# Patient Record
Sex: Female | Born: 1965 | Smoking: Former smoker
Health system: Southern US, Community
[De-identification: ages and names within clinical notes are randomized; demographics above are authoritative.]

---

## 2013-03-16 DIAGNOSIS — I10 Essential (primary) hypertension: Secondary | ICD-10-CM | POA: Insufficient documentation

## 2013-10-31 DIAGNOSIS — F411 Generalized anxiety disorder: Secondary | ICD-10-CM | POA: Insufficient documentation

## 2018-04-25 DIAGNOSIS — M353 Polymyalgia rheumatica: Secondary | ICD-10-CM | POA: Insufficient documentation

## 2020-02-07 DIAGNOSIS — E538 Deficiency of other specified B group vitamins: Secondary | ICD-10-CM | POA: Insufficient documentation

## 2020-11-04 ENCOUNTER — Other Ambulatory Visit: Payer: Self-pay | Admitting: Adult Health

## 2020-11-04 DIAGNOSIS — F411 Generalized anxiety disorder: Secondary | ICD-10-CM

## 2020-11-04 MED ORDER — CLONAZEPAM 1 MG PO TABS: 1.0000 mg | ORAL_TABLET | Freq: Two times a day (BID) | ORAL | 2 refills | Status: DC | PRN

## 2020-12-10 ENCOUNTER — Other Ambulatory Visit: Payer: Self-pay | Admitting: Adult Health

## 2020-12-10 DIAGNOSIS — G4700 Insomnia, unspecified: Secondary | ICD-10-CM

## 2020-12-10 MED ORDER — ZOLPIDEM TARTRATE 10 MG PO TABS: 10.0000 mg | ORAL_TABLET | Freq: Every evening | ORAL | 2 refills | Status: DC | PRN

## 2021-01-27 ENCOUNTER — Other Ambulatory Visit: Payer: Self-pay | Admitting: Adult Health

## 2021-01-27 DIAGNOSIS — F411 Generalized anxiety disorder: Secondary | ICD-10-CM

## 2021-01-27 MED ORDER — CLONAZEPAM 1 MG PO TABS: 1.0000 mg | ORAL_TABLET | Freq: Two times a day (BID) | ORAL | 2 refills | Status: DC | PRN

## 2021-03-10 ENCOUNTER — Other Ambulatory Visit: Payer: Self-pay | Admitting: Adult Health

## 2021-03-10 DIAGNOSIS — G4700 Insomnia, unspecified: Secondary | ICD-10-CM

## 2021-03-10 MED ORDER — ZOLPIDEM TARTRATE 10 MG PO TABS: 10.0000 mg | ORAL_TABLET | Freq: Every evening | ORAL | 2 refills | Status: DC | PRN

## 2021-04-05 ENCOUNTER — Other Ambulatory Visit: Payer: Self-pay | Admitting: Adult Health

## 2021-04-05 DIAGNOSIS — F411 Generalized anxiety disorder: Secondary | ICD-10-CM

## 2021-04-05 DIAGNOSIS — G4700 Insomnia, unspecified: Secondary | ICD-10-CM

## 2021-04-05 MED ORDER — FLUOXETINE HCL 20 MG PO CAPS: 60.0000 mg | ORAL_CAPSULE | Freq: Every day | ORAL | 1 refills | Status: DC

## 2021-04-06 DIAGNOSIS — K2970 Gastritis, unspecified, without bleeding: Secondary | ICD-10-CM | POA: Insufficient documentation

## 2021-04-14 ENCOUNTER — Other Ambulatory Visit: Payer: Self-pay | Admitting: Adult Health

## 2021-04-14 DIAGNOSIS — F411 Generalized anxiety disorder: Secondary | ICD-10-CM

## 2021-04-14 MED ORDER — FLUOXETINE HCL 40 MG PO CAPS: 40.0000 mg | ORAL_CAPSULE | Freq: Every day | ORAL | 3 refills | Status: DC

## 2021-04-20 ENCOUNTER — Other Ambulatory Visit: Payer: Self-pay | Admitting: Adult Health

## 2021-04-20 DIAGNOSIS — F411 Generalized anxiety disorder: Secondary | ICD-10-CM

## 2021-04-20 MED ORDER — CLONAZEPAM 1 MG PO TABS: 1.0000 mg | ORAL_TABLET | Freq: Two times a day (BID) | ORAL | 2 refills | Status: DC | PRN

## 2021-04-21 ENCOUNTER — Telehealth (INDEPENDENT_AMBULATORY_CARE_PROVIDER_SITE_OTHER): Payer: Self-pay | Admitting: Adult Health

## 2021-04-21 DIAGNOSIS — F411 Generalized anxiety disorder: Secondary | ICD-10-CM

## 2021-04-21 DIAGNOSIS — F331 Major depressive disorder, recurrent, moderate: Secondary | ICD-10-CM

## 2021-04-21 DIAGNOSIS — G4700 Insomnia, unspecified: Secondary | ICD-10-CM

## 2021-04-21 NOTE — Progress Notes (Addendum)
Amber Joyce [id number] [birth year] 55 y.o.  Virtual Visit via Telephone Note  I connected with pt on 04/21/21 at  6:00 PM EDT by telephone and verified that I am speaking with the correct person using two identifiers.   I discussed the limitations, risks, security and privacy concerns of performing an evaluation and management service by telephone and the availability of in person appointments. I also discussed with the patient that there may be a patient responsible charge related to this service. The patient expressed understanding and agreed to proceed.   I discussed the assessment and treatment plan with the patient. The patient was provided an opportunity to ask questions and all were answered. The patient agreed with the plan and demonstrated an understanding of the instructions.   The patient was advised to call back or seek an in-person evaluation if the symptoms worsen or if the condition fails to improve as anticipated.  I provided 20 minutes of non-face-to-face time during this encounter.  The patient was located at home.  The provider was located at [facility].   Dorothyann Gibbs, NP   Subjective:   Patient ID:  Amber Joyce is a 55 y.o. (DOB Aug 08, 1966) female.  Chief Complaint: No chief complaint on file.   HPI  Kellianne Ek presents for follow-up of MDD, GAD, and insomnia.  Describes mood today as "ok". Pleasant. Denies tearfulness. Mood symptoms - denies depression, anxiety, and irritability. Stating "I'm doing pretty good". She and husband going well. Daughter and grandson local. Stable interest and motivation. Taking medications as prescribed.  Energy levels stable. Active, does not have a regular exercise routine. Enjoys some usual interests and activities. Married. Lives with husband. Spending time with family. Appetite adequate. Weight stable. Sleeps well most nights. Averages 8 hours. Focus and concentration stable. Completing tasks. Managing aspects  of household.  Works full-time. Denies SI or HI.  Denies AH or VH.  Previous medication trials:  Denies  Review of Systems:  Review of Systems  Musculoskeletal:  Negative for gait problem.  Neurological:  Negative for tremors.  Psychiatric/Behavioral:         Please refer to HPI   Medications: I have reviewed the patient's current medications.  Current Outpatient Medications  Medication Sig Dispense Refill   clonazePAM (KLONOPIN) 1 MG tablet Take 1 tablet (1 mg total) by mouth 2 (two) times daily as needed. 60 tablet 2   fenofibrate micronized (LOFIBRA) 200 MG capsule Take 200 mg by mouth daily.     FLUoxetine (PROZAC) 20 MG capsule Take 3 capsules (60 mg total) by mouth daily. 90 capsule 1   FLUoxetine (PROZAC) 40 MG capsule Take 1 capsule (40 mg total) by mouth daily. 90 capsule 3   fluticasone (FLONASE) 50 MCG/ACT nasal spray Place into both nostrils.     metFORMIN (GLUCOPHAGE) 1000 MG tablet Take 1,000 mg by mouth daily.     omeprazole (PRILOSEC) 40 MG capsule Take 40 mg by mouth 2 (two) times daily.     OZEMPIC, 1 MG/DOSE, 4 MG/3ML SOPN      predniSONE (DELTASONE) 1 MG tablet Take 4 mg by mouth daily.     promethazine (PHENERGAN) 25 MG tablet Take by mouth.     rosuvastatin (CRESTOR) 20 MG tablet Take 20 mg by mouth at bedtime.     telmisartan-hydrochlorothiazide (MICARDIS HCT) 40-12.5 MG tablet Take 1 tablet by mouth daily.     zolpidem (AMBIEN) 10 MG tablet Take 1 tablet (10 mg total) by mouth at bedtime as  needed. 30 tablet 2   No current facility-administered medications for this visit.    Medication Side Effects: None  Allergies:  Allergies  Allergen Reactions   Prednisone Anaphylaxis    Other reaction(s): Other (See Comments)   Azithromycin     Other reaction(s): GI Upset (intolerance)    No past medical history on file.  No family history on file.  Social History   Socioeconomic History   Marital status: Unknown    Spouse name: Not on file   Number of  children: Not on file   Years of education: Not on file   Highest education level: Not on file  Occupational History   Not on file  Tobacco Use   Smoking status: Not on file   Smokeless tobacco: Not on file  Substance and Sexual Activity   Alcohol use: Not on file   Drug use: Not on file   Sexual activity: Not on file  Other Topics Concern   Not on file  Social History Narrative   Not on file   Social Determinants of Health   Financial Resource Strain: Not on file  Food Insecurity: Not on file  Transportation Needs: Not on file  Physical Activity: Not on file  Stress: Not on file  Social Connections: Not on file  Intimate Partner Violence: Not on file    Past Medical History, Surgical history, Social history, and Family history were reviewed and updated as appropriate.   Please see review of systems for further details on the patient's review from today.   Objective:   Physical Exam:  There were no vitals taken for this visit.  Physical Exam Constitutional:      General: She is not in acute distress. Musculoskeletal:        General: No deformity.  Neurological:     Mental Status: She is alert and oriented to person, place, and time.     Coordination: Coordination normal.  Psychiatric:        Attention and Perception: Attention and perception normal. She does not perceive auditory or visual hallucinations.        Mood and Affect: Mood normal. Mood is not anxious or depressed. Affect is not labile, blunt, angry or inappropriate.        Speech: Speech normal.        Behavior: Behavior normal.        Thought Content: Thought content normal. Thought content is not paranoid or delusional. Thought content does not include homicidal or suicidal ideation. Thought content does not include homicidal or suicidal plan.        Cognition and Memory: Cognition and memory normal.        Judgment: Judgment normal.     Comments: Insight intact    Lab Review:  No results found  for: NA, K, CL, CO2, GLUCOSE, BUN, CREATININE, CALCIUM, PROT, ALBUMIN, AST, ALT, ALKPHOS, BILITOT, GFRNONAA, GFRAA  No results found for: WBC, RBC, HGB, HCT, PLT, MCV, MCH, MCHC, RDW, LYMPHSABS, MONOABS, EOSABS, BASOSABS  No results found for: POCLITH, LITHIUM   No results found for: PHENYTOIN, PHENOBARB, VALPROATE, CBMZ   .res Assessment: Plan:    Plan:  PDMP reviewed  Clonazepam 1mg  BID Prozac 40mg  daily Ambien 10mg  at hs  RTC 6 months  Patient advised to contact office with any questions, adverse effects, or acute worsening in signs and symptoms.  Discussed potential benefits, risk, and side effects of benzodiazepines to include potential risk of tolerance and dependence, as well as possible  drowsiness.  Advised patient not to drive if experiencing drowsiness and to take lowest possible effective dose to minimize risk of dependence and tolerance.     Diagnoses and all orders for this visit:  Insomnia, unspecified type  Generalized anxiety disorder  Major depressive disorder, recurrent episode, moderate (HCC)   Please see After Visit Summary for patient specific instructions.  Future Appointments  Date Time Provider Department Center  04/21/2021  6:00 PM Kimbly Eanes, Thereasa Solo, NP CP-CP None    No orders of the defined types were placed in this encounter.     -------------------------------

## 2021-06-03 ENCOUNTER — Other Ambulatory Visit: Payer: Self-pay | Admitting: Adult Health

## 2021-06-03 DIAGNOSIS — G4700 Insomnia, unspecified: Secondary | ICD-10-CM

## 2021-06-04 NOTE — Telephone Encounter (Signed)
Appt due next year last filled 7/29

## 2021-07-14 ENCOUNTER — Other Ambulatory Visit: Payer: Self-pay | Admitting: Adult Health

## 2021-07-14 DIAGNOSIS — F411 Generalized anxiety disorder: Secondary | ICD-10-CM

## 2021-07-14 MED ORDER — CLONAZEPAM 1 MG PO TABS
1.0000 mg | ORAL_TABLET | Freq: Two times a day (BID) | ORAL | 2 refills | Status: DC | PRN
Start: 2021-07-14 — End: 2021-08-27

## 2021-08-27 ENCOUNTER — Other Ambulatory Visit: Payer: Self-pay | Admitting: Adult Health

## 2021-08-27 DIAGNOSIS — F411 Generalized anxiety disorder: Secondary | ICD-10-CM

## 2021-08-27 DIAGNOSIS — G4700 Insomnia, unspecified: Secondary | ICD-10-CM

## 2021-08-27 MED ORDER — ZOLPIDEM TARTRATE 10 MG PO TABS: 10.0000 mg | ORAL_TABLET | Freq: Every evening | ORAL | 2 refills | Status: DC | PRN

## 2021-08-27 MED ORDER — CLONAZEPAM 1 MG PO TABS: 1.0000 mg | ORAL_TABLET | Freq: Two times a day (BID) | ORAL | 2 refills | Status: DC | PRN

## 2021-10-12 ENCOUNTER — Other Ambulatory Visit: Payer: Self-pay | Admitting: Adult Health

## 2021-10-12 DIAGNOSIS — G4700 Insomnia, unspecified: Secondary | ICD-10-CM

## 2021-10-12 MED ORDER — FLUOXETINE HCL 20 MG PO CAPS: 60.0000 mg | ORAL_CAPSULE | Freq: Every day | ORAL | 1 refills | Status: DC

## 2021-11-16 DIAGNOSIS — D649 Anemia, unspecified: Secondary | ICD-10-CM | POA: Insufficient documentation

## 2021-11-16 DIAGNOSIS — Z1231 Encounter for screening mammogram for malignant neoplasm of breast: Secondary | ICD-10-CM | POA: Insufficient documentation

## 2021-11-16 DIAGNOSIS — E781 Pure hyperglyceridemia: Secondary | ICD-10-CM | POA: Insufficient documentation

## 2021-11-18 ENCOUNTER — Other Ambulatory Visit: Payer: Self-pay | Admitting: Adult Health

## 2021-11-18 DIAGNOSIS — G4700 Insomnia, unspecified: Secondary | ICD-10-CM

## 2021-11-18 MED ORDER — ZOLPIDEM TARTRATE 10 MG PO TABS: 10.0000 mg | ORAL_TABLET | Freq: Every evening | ORAL | 2 refills | Status: DC | PRN

## 2021-11-30 ENCOUNTER — Other Ambulatory Visit: Payer: Self-pay | Admitting: Adult Health

## 2021-11-30 DIAGNOSIS — F411 Generalized anxiety disorder: Secondary | ICD-10-CM

## 2021-11-30 MED ORDER — CLONAZEPAM 1 MG PO TABS: 1.0000 mg | ORAL_TABLET | Freq: Two times a day (BID) | ORAL | 2 refills | Status: DC | PRN

## 2021-12-16 ENCOUNTER — Telehealth: Payer: Self-pay | Admitting: Adult Health

## 2021-12-16 NOTE — Telephone Encounter (Signed)
Received [employer] Forms. Placed in Traci's box 3/9 ?

## 2021-12-28 NOTE — Telephone Encounter (Signed)
Please see message from patient regarding disability paperwork.  ?

## 2021-12-28 NOTE — Telephone Encounter (Signed)
Pt LVM @10 :01a.  She said she was returning a call from [place].  She said if the paperwork was from [place] to throw away.  She said another doctor got the same paperwork and she's not sure what it's about. ? ?No upcoming appt scheduled. ?

## 2021-12-29 NOTE — Telephone Encounter (Signed)
Noted thanks and will do. ?

## 2022-01-11 DIAGNOSIS — R5382 Chronic fatigue, unspecified: Secondary | ICD-10-CM | POA: Insufficient documentation

## 2022-02-08 ENCOUNTER — Other Ambulatory Visit: Payer: Self-pay | Admitting: Adult Health

## 2022-02-08 DIAGNOSIS — G4700 Insomnia, unspecified: Secondary | ICD-10-CM

## 2022-02-08 NOTE — Telephone Encounter (Signed)
Last filled 4/6, due 5/4 ?

## 2022-02-24 ENCOUNTER — Other Ambulatory Visit: Payer: Self-pay | Admitting: Adult Health

## 2022-02-24 DIAGNOSIS — F411 Generalized anxiety disorder: Secondary | ICD-10-CM

## 2022-03-31 ENCOUNTER — Other Ambulatory Visit: Payer: Self-pay

## 2022-03-31 ENCOUNTER — Emergency Department (HOSPITAL_BASED_OUTPATIENT_CLINIC_OR_DEPARTMENT_OTHER)
Admission: EM | Admit: 2022-03-31 | Discharge: 2022-03-31 | Disposition: A | Discharge status: Home / Self Care | Payer: Self-pay | Attending: Emergency Medicine | Admitting: Emergency Medicine

## 2022-03-31 ENCOUNTER — Encounter (HOSPITAL_BASED_OUTPATIENT_CLINIC_OR_DEPARTMENT_OTHER): Payer: Self-pay | Admitting: Pediatrics

## 2022-03-31 ENCOUNTER — Emergency Department (HOSPITAL_BASED_OUTPATIENT_CLINIC_OR_DEPARTMENT_OTHER): Payer: Self-pay

## 2022-03-31 DIAGNOSIS — S32424A Nondisplaced fracture of posterior wall of right acetabulum, initial encounter for closed fracture: Secondary | ICD-10-CM

## 2022-03-31 DIAGNOSIS — Z7984 Long term (current) use of oral hypoglycemic drugs: Secondary | ICD-10-CM | POA: Insufficient documentation

## 2022-03-31 DIAGNOSIS — W010XXA Fall on same level from slipping, tripping and stumbling without subsequent striking against object, initial encounter: Secondary | ICD-10-CM | POA: Insufficient documentation

## 2022-03-31 MED ORDER — OXYCODONE-ACETAMINOPHEN 5-325 MG PO TABS: 1.0000 | ORAL_TABLET | Freq: Three times a day (TID) | ORAL | 0 refills | Status: AC | PRN

## 2022-03-31 MED ORDER — FENTANYL CITRATE PF 50 MCG/ML IJ SOSY
25.0000 ug | PREFILLED_SYRINGE | Freq: Once | INTRAMUSCULAR | Status: AC
  Administered 2022-03-31: 25 ug via INTRAVENOUS
  Filled 2022-03-31: qty 1

## 2022-03-31 MED ORDER — OXYCODONE-ACETAMINOPHEN 5-325 MG PO TABS
1.0000 | ORAL_TABLET | ORAL | Status: AC | PRN
  Administered 2022-03-31 (×2): 1 via ORAL
  Filled 2022-03-31 (×2): qty 1

## 2022-03-31 MED ORDER — BACITRACIN ZINC 500 UNIT/GM EX OINT
TOPICAL_OINTMENT | Freq: Once | CUTANEOUS | Status: AC
Start: 2022-03-31 — End: 2022-03-31
  Filled 2022-03-31: qty 28.35

## 2022-03-31 NOTE — ED Notes (Signed)
Patient verbalizes understanding of discharge instructions. Opportunity for questioning and answers were provided. Armband removed by staff, pt discharged from ED. Ambulated out to lobby with walker per request

## 2022-03-31 NOTE — ED Notes (Signed)
Pt. Reports fall today with pain in the R hip and around to the R back area.  Pt. Reports she has had pain in the R pelvic area as well.  Pt. Able to bend with no pain.  Pt. Has noted injury to the R knee and just below the R knee an abrasion noted with controlled bleeding.

## 2022-03-31 NOTE — ED Notes (Signed)
Spoke with Thayer Ohm, answering service for Dr Linna Caprice , ortho on call at [facility]

## 2022-03-31 NOTE — ED Triage Notes (Signed)
Tripped over a cement walkway; c/o right knee and right hip pain;

## 2022-03-31 NOTE — ED Notes (Signed)
Patient transported to CT 

## 2022-03-31 NOTE — Discharge Instructions (Addendum)
Touchdown weight bearing is defined as not supporting any weight on the affected side by only touching the plantar aspect of the foot to the ground to maintain balance to protect the affected side from mechanical loading You will need to use the walker as directed. You will need to use this without weight bearing fully on your R leg as described above. I have placed you on pain medication.  You can take this with ibuprofen as needed for your discomfort. You will need to follow-up with Dr. Jena Gauss listed below within a week.  Call them tomorrow to make an appointment. Return to the ER if you start to experience worsening pain, additional injuries, numbness or weakness.

## 2022-05-02 ENCOUNTER — Other Ambulatory Visit: Payer: Self-pay | Admitting: Adult Health

## 2022-05-02 DIAGNOSIS — G4700 Insomnia, unspecified: Secondary | ICD-10-CM

## 2022-05-02 DIAGNOSIS — F411 Generalized anxiety disorder: Secondary | ICD-10-CM

## 2022-05-02 MED ORDER — CLONAZEPAM 1 MG PO TABS: 1.0000 mg | ORAL_TABLET | Freq: Two times a day (BID) | ORAL | 2 refills | Status: DC | PRN

## 2022-05-02 MED ORDER — ZOLPIDEM TARTRATE 10 MG PO TABS: 10.0000 mg | ORAL_TABLET | Freq: Every evening | ORAL | 2 refills | Status: DC | PRN

## 2022-05-02 MED ORDER — FLUOXETINE HCL 40 MG PO CAPS: 40.0000 mg | ORAL_CAPSULE | Freq: Every day | ORAL | 3 refills | Status: DC

## 2022-05-06 ENCOUNTER — Other Ambulatory Visit: Payer: Self-pay | Admitting: Adult Health

## 2022-05-06 DIAGNOSIS — G4700 Insomnia, unspecified: Secondary | ICD-10-CM

## 2022-05-06 DIAGNOSIS — F411 Generalized anxiety disorder: Secondary | ICD-10-CM

## 2022-05-06 MED ORDER — FLUOXETINE HCL 20 MG PO CAPS: 20.0000 mg | ORAL_CAPSULE | Freq: Every day | ORAL | 1 refills | Status: DC

## 2022-05-18 DIAGNOSIS — Z9181 History of falling: Secondary | ICD-10-CM | POA: Insufficient documentation

## 2022-05-20 ENCOUNTER — Other Ambulatory Visit: Payer: Self-pay | Admitting: Adult Health

## 2022-05-20 DIAGNOSIS — F411 Generalized anxiety disorder: Secondary | ICD-10-CM

## 2022-05-20 MED ORDER — CLONAZEPAM 1 MG PO TABS: 1.0000 mg | ORAL_TABLET | Freq: Two times a day (BID) | ORAL | 2 refills | Status: DC | PRN

## 2022-06-06 ENCOUNTER — Other Ambulatory Visit: Payer: Self-pay | Admitting: Adult Health

## 2022-06-06 DIAGNOSIS — G4700 Insomnia, unspecified: Secondary | ICD-10-CM

## 2022-06-07 NOTE — Telephone Encounter (Signed)
Pt coming in tomorrow at 1pm.

## 2022-06-07 NOTE — Telephone Encounter (Signed)
Please schedule appt

## 2022-06-08 ENCOUNTER — Encounter: Payer: Self-pay | Admitting: Adult Health

## 2022-06-08 ENCOUNTER — Ambulatory Visit (INDEPENDENT_AMBULATORY_CARE_PROVIDER_SITE_OTHER): Payer: Self-pay | Admitting: Adult Health

## 2022-06-08 DIAGNOSIS — G4700 Insomnia, unspecified: Secondary | ICD-10-CM

## 2022-06-08 DIAGNOSIS — F331 Major depressive disorder, recurrent, moderate: Secondary | ICD-10-CM

## 2022-06-08 DIAGNOSIS — F411 Generalized anxiety disorder: Secondary | ICD-10-CM

## 2022-06-08 MED ORDER — ZOLPIDEM TARTRATE 10 MG PO TABS: 10.0000 mg | ORAL_TABLET | Freq: Every evening | ORAL | 2 refills | Status: DC | PRN

## 2022-06-08 NOTE — Progress Notes (Signed)
Amber Joyce [id number] [birth year] 56 y.o.  Subjective:   Patient ID:  Amber Joyce is a 56 y.o. (DOB Aug 02, 1966) female.  Chief Complaint: No chief complaint on file.   HPI Wanna Gully presents to the office today for follow-up of MDD, GAD, and insomnia.  Describes mood today as "ok". Pleasant. Denies tearfulness. Mood symptoms - denies depression, anxiety, and irritability. Mood is consistent. Stating "I'm doing pretty good". She and husband going well. Daughter and grandson local. Stable interest and motivation. Taking medications as prescribed.  Energy levels stable. Active, does not have a regular exercise routine. Enjoys some usual interests and activities. Married. Lives with husband. Spending time with family. Appetite adequate. Weight stable. Sleeps well most nights. Averages 8 hours. Focus and concentration stable. Completing tasks. Managing aspects of household. Unemployed currently. Denies SI or HI.  Denies AH or VH.  Previous medication trials:  Denies      Review of Systems:  Review of Systems  Musculoskeletal:  Negative for gait problem.  Neurological:  Negative for tremors.  Psychiatric/Behavioral:         Please refer to HPI    Medications: I have reviewed the patient's current medications.  Current Outpatient Medications  Medication Sig Dispense Refill   clonazePAM (KLONOPIN) 1 MG tablet Take 1 tablet (1 mg total) by mouth 2 (two) times daily as needed. 60 tablet 2   fenofibrate micronized (LOFIBRA) 200 MG capsule Take 200 mg by mouth daily.     FLUoxetine (PROZAC) 20 MG capsule Take 1 capsule (20 mg total) by mouth daily. 90 capsule 1   fluticasone (FLONASE) 50 MCG/ACT nasal spray Place into both nostrils.     metFORMIN (GLUCOPHAGE) 1000 MG tablet Take 1,000 mg by mouth daily.     omeprazole (PRILOSEC) 40 MG capsule Take 40 mg by mouth 2 (two) times daily.     oxyCODONE-acetaminophen (PERCOCET/ROXICET) 5-325 MG tablet Take 1 tablet by mouth every 8 (eight)  hours as needed for severe pain. 10 tablet 0   OZEMPIC, 1 MG/DOSE, 4 MG/3ML SOPN      predniSONE (DELTASONE) 1 MG tablet Take 4 mg by mouth daily.     promethazine (PHENERGAN) 25 MG tablet Take by mouth.     rosuvastatin (CRESTOR) 20 MG tablet Take 20 mg by mouth at bedtime.     telmisartan-hydrochlorothiazide (MICARDIS HCT) 40-12.5 MG tablet Take 1 tablet by mouth daily.     zolpidem (AMBIEN) 10 MG tablet Take 1 tablet (10 mg total) by mouth at bedtime as needed. 30 tablet 2   No current facility-administered medications for this visit.    Medication Side Effects: None    Allergies:  Allergies  Allergen Reactions   Azithromycin     Other reaction(s): GI Upset (intolerance)    No past medical history on file.  Past Medical History, Surgical history, Social history, and Family history were reviewed and updated as appropriate.   Please see review of systems for further details on the patient's review from today.   Objective:   Physical Exam:  There were no vitals taken for this visit.  Physical Exam Constitutional:      General: She is not in acute distress. Musculoskeletal:        General: No deformity.  Neurological:     Mental Status: She is alert and oriented to person, place, and time.     Coordination: Coordination normal.  Psychiatric:        Attention and Perception: Attention and perception normal. She does  not perceive auditory or visual hallucinations.        Mood and Affect: Mood normal. Mood is not anxious or depressed. Affect is not labile, blunt, angry or inappropriate.        Speech: Speech normal.        Behavior: Behavior normal.        Thought Content: Thought content normal. Thought content is not paranoid or delusional. Thought content does not include homicidal or suicidal ideation. Thought content does not include homicidal or suicidal plan.        Cognition and Memory: Cognition and memory normal.        Judgment: Judgment normal.     Comments:  Insight intact     Lab Review:  No results found for: "NA", "K", "CL", "CO2", "GLUCOSE", "BUN", "CREATININE", "CALCIUM", "PROT", "ALBUMIN", "AST", "ALT", "ALKPHOS", "BILITOT", "GFRNONAA", "GFRAA"  No results found for: "WBC", "RBC", "HGB", "HCT", "PLT", "MCV", "MCH", "MCHC", "RDW", "LYMPHSABS", "MONOABS", "EOSABS", "BASOSABS"  No results found for: "POCLITH", "LITHIUM"   No results found for: "PHENYTOIN", "PHENOBARB", "VALPROATE", "CBMZ"   .res Assessment: Plan:    Plan:  PDMP reviewed  Clonazepam 1mg  BID Prozac 40mg  daily Ambien 10mg  at hs  RTC 1 year  Patient advised to contact office with any questions, adverse effects, or acute worsening in signs and symptoms.  Discussed potential benefits, risk, and side effects of benzodiazepines to include potential risk of tolerance and dependence, as well as possible drowsiness.  Advised patient not to drive if experiencing drowsiness and to take lowest possible effective dose to minimize risk of dependence and tolerance.    There are no diagnoses linked to this encounter.   Please see After Visit Summary for patient specific instructions.  No future appointments.   No orders of the defined types were placed in this encounter.   -------------------------------

## 2022-08-16 ENCOUNTER — Other Ambulatory Visit: Payer: Self-pay | Admitting: Adult Health

## 2022-08-16 DIAGNOSIS — F411 Generalized anxiety disorder: Secondary | ICD-10-CM

## 2022-08-16 MED ORDER — CLONAZEPAM 1 MG PO TABS: 1.0000 mg | ORAL_TABLET | Freq: Two times a day (BID) | ORAL | 2 refills | Status: DC | PRN

## 2022-08-29 ENCOUNTER — Other Ambulatory Visit: Payer: Self-pay | Admitting: Adult Health

## 2022-08-29 DIAGNOSIS — G4700 Insomnia, unspecified: Secondary | ICD-10-CM

## 2022-08-29 MED ORDER — ZOLPIDEM TARTRATE 10 MG PO TABS: 10.0000 mg | ORAL_TABLET | Freq: Every evening | ORAL | 2 refills | Status: DC | PRN

## 2022-09-29 ENCOUNTER — Other Ambulatory Visit: Payer: Self-pay | Admitting: Adult Health

## 2022-09-29 DIAGNOSIS — G4700 Insomnia, unspecified: Secondary | ICD-10-CM

## 2022-11-03 ENCOUNTER — Other Ambulatory Visit: Payer: Self-pay | Admitting: Adult Health

## 2022-11-03 DIAGNOSIS — F411 Generalized anxiety disorder: Secondary | ICD-10-CM

## 2022-11-08 ENCOUNTER — Other Ambulatory Visit: Payer: Self-pay | Admitting: Adult Health

## 2022-11-08 DIAGNOSIS — G4700 Insomnia, unspecified: Secondary | ICD-10-CM

## 2022-11-08 DIAGNOSIS — F411 Generalized anxiety disorder: Secondary | ICD-10-CM

## 2022-11-08 MED ORDER — ZOLPIDEM TARTRATE 10 MG PO TABS: 10.0000 mg | ORAL_TABLET | Freq: Every evening | ORAL | 2 refills | Status: DC | PRN

## 2022-11-08 MED ORDER — CLONAZEPAM 1 MG PO TABS: 1.0000 mg | ORAL_TABLET | Freq: Two times a day (BID) | ORAL | 2 refills | Status: DC | PRN

## 2022-11-21 ENCOUNTER — Other Ambulatory Visit: Payer: Self-pay | Admitting: Adult Health

## 2022-11-21 DIAGNOSIS — G4700 Insomnia, unspecified: Secondary | ICD-10-CM

## 2022-11-21 MED ORDER — ZOLPIDEM TARTRATE 10 MG PO TABS: 10.0000 mg | ORAL_TABLET | Freq: Every evening | ORAL | 2 refills | Status: DC | PRN

## 2023-01-20 ENCOUNTER — Other Ambulatory Visit: Payer: Self-pay | Admitting: Adult Health

## 2023-01-20 DIAGNOSIS — G4700 Insomnia, unspecified: Secondary | ICD-10-CM

## 2023-01-20 MED ORDER — ZOLPIDEM TARTRATE 10 MG PO TABS: 10.0000 mg | ORAL_TABLET | Freq: Every evening | ORAL | 2 refills | Status: DC | PRN

## 2023-01-31 ENCOUNTER — Other Ambulatory Visit: Payer: Self-pay | Admitting: Adult Health

## 2023-01-31 DIAGNOSIS — F411 Generalized anxiety disorder: Secondary | ICD-10-CM

## 2023-01-31 DIAGNOSIS — G4700 Insomnia, unspecified: Secondary | ICD-10-CM

## 2023-01-31 MED ORDER — CLONAZEPAM 1 MG PO TABS: 1.0000 mg | ORAL_TABLET | Freq: Two times a day (BID) | ORAL | 2 refills | Status: DC | PRN

## 2023-01-31 MED ORDER — ZOLPIDEM TARTRATE 10 MG PO TABS: 10.0000 mg | ORAL_TABLET | Freq: Every evening | ORAL | 2 refills | Status: DC | PRN

## 2023-02-21 ENCOUNTER — Other Ambulatory Visit: Payer: Self-pay | Admitting: Adult Health

## 2023-02-21 DIAGNOSIS — G4700 Insomnia, unspecified: Secondary | ICD-10-CM

## 2023-02-21 MED ORDER — FLUOXETINE HCL 20 MG PO CAPS: 20.0000 mg | ORAL_CAPSULE | Freq: Every day | ORAL | 3 refills | Status: DC

## 2023-04-11 ENCOUNTER — Other Ambulatory Visit: Payer: Self-pay | Admitting: Adult Health

## 2023-04-11 DIAGNOSIS — G4700 Insomnia, unspecified: Secondary | ICD-10-CM

## 2023-04-11 MED ORDER — ZOLPIDEM TARTRATE 10 MG PO TABS: 10.0000 mg | ORAL_TABLET | Freq: Every evening | ORAL | 2 refills | Status: DC | PRN

## 2023-04-19 ENCOUNTER — Other Ambulatory Visit: Payer: Self-pay | Admitting: Adult Health

## 2023-04-19 DIAGNOSIS — F411 Generalized anxiety disorder: Secondary | ICD-10-CM

## 2023-04-19 MED ORDER — CLONAZEPAM 1 MG PO TABS: 1.0000 mg | ORAL_TABLET | Freq: Three times a day (TID) | ORAL | 2 refills | Status: DC | PRN

## 2023-05-09 ENCOUNTER — Other Ambulatory Visit: Payer: Self-pay | Admitting: Adult Health

## 2023-05-09 DIAGNOSIS — G4700 Insomnia, unspecified: Secondary | ICD-10-CM

## 2023-05-09 MED ORDER — FLUOXETINE HCL 20 MG PO CAPS
20.0000 mg | ORAL_CAPSULE | Freq: Every day | ORAL | 3 refills | Status: DC
Start: 2023-05-09 — End: 2024-03-08

## 2023-06-07 ENCOUNTER — Ambulatory Visit: Payer: Self-pay | Admitting: Adult Health

## 2023-06-14 ENCOUNTER — Ambulatory Visit (INDEPENDENT_AMBULATORY_CARE_PROVIDER_SITE_OTHER): Payer: Self-pay | Admitting: Adult Health

## 2023-06-14 ENCOUNTER — Encounter: Payer: Self-pay | Admitting: Adult Health

## 2023-06-14 DIAGNOSIS — G4700 Insomnia, unspecified: Secondary | ICD-10-CM

## 2023-06-14 DIAGNOSIS — F411 Generalized anxiety disorder: Secondary | ICD-10-CM

## 2023-06-14 NOTE — Progress Notes (Signed)
Amber Joyce [id number] Feb 26, 1966 57 y.o.  Virtual Visit via Telephone Note  I connected with pt on 06/14/23 at  3:20 PM EDT by telephone and verified that I am speaking with the correct person using two identifiers.   I discussed the limitations, risks, security and privacy concerns of performing an evaluation and management service by telephone and the availability of in person appointments. I also discussed with the patient that there may be a patient responsible charge related to this service. The patient expressed understanding and agreed to proceed.   I discussed the assessment and treatment plan with the patient. The patient was provided an opportunity to ask questions and all were answered. The patient agreed with the plan and demonstrated an understanding of the instructions.   The patient was advised to call back or seek an in-person evaluation if the symptoms worsen or if the condition fails to improve as anticipated.  I provided 10 minutes of non-face-to-face time during this encounter.  The patient was located at home.  The provider was located at [facility].   Dorothyann Gibbs, NP   Subjective:   Patient ID:  Amber Joyce is a 57 y.o. (DOB [birth year]) female.  Chief Complaint: No chief complaint on file.   HPI Amber Joyce presents for follow-up of MDD, GAD, and insomnia.  Describes mood today as "ok". Pleasant. Denies tearfulness. Mood symptoms - denies depression, anxiety, and irritability. Denies panic attacks. Denies worry, rumination and over thinking. Mood is consistent. Stating "I fell like I'm doing ok". Feels like medications work well for her. Stable interest and motivation. Taking medications as prescribed.  Energy levels stable. Active, does not have a regular exercise routine. Enjoys some usual interests and activities. Married. Lives with husband. Daughter and grandson local. Spending time with family. Appetite adequate. Weight loss. Sleeps well  most nights. Averages 7 to 8 hours. Focus and concentration stable. Completing tasks. Managing aspects of household. Working full time. Denies SI or HI.  Denies AH or VH. Denies self harm. Denies substance use.  Previous medication trials:  Denies   Review of Systems:  Review of Systems  Musculoskeletal:  Negative for gait problem.  Neurological:  Negative for tremors.  Psychiatric/Behavioral:         Please refer to HPI    Medications: I have reviewed the patient's current medications.  Current Outpatient Medications  Medication Sig Dispense Refill   clonazePAM (KLONOPIN) 1 MG tablet Take 1 tablet (1 mg total) by mouth 3 (three) times daily as needed. 90 tablet 2   fenofibrate micronized (LOFIBRA) 200 MG capsule Take 200 mg by mouth daily.     FLUoxetine (PROZAC) 20 MG capsule Take 1 capsule (20 mg total) by mouth daily. 90 capsule 3   fluticasone (FLONASE) 50 MCG/ACT nasal spray Place into both nostrils.     metFORMIN (GLUCOPHAGE) 1000 MG tablet Take 1,000 mg by mouth daily.     omeprazole (PRILOSEC) 40 MG capsule Take 40 mg by mouth 2 (two) times daily.     oxyCODONE-acetaminophen (PERCOCET/ROXICET) 5-325 MG tablet Take 1 tablet by mouth every 8 (eight) hours as needed for severe pain. 10 tablet 0   OZEMPIC, 1 MG/DOSE, 4 MG/3ML SOPN      predniSONE (DELTASONE) 1 MG tablet Take 4 mg by mouth daily.     promethazine (PHENERGAN) 25 MG tablet Take by mouth.     rosuvastatin (CRESTOR) 20 MG tablet Take 20 mg by mouth at bedtime.     telmisartan-hydrochlorothiazide (MICARDIS  HCT) 40-12.5 MG tablet Take 1 tablet by mouth daily.     zolpidem (AMBIEN) 10 MG tablet Take 1 tablet (10 mg total) by mouth at bedtime as needed for sleep. 30 tablet 2   No current facility-administered medications for this visit.    Medication Side Effects: None  Allergies:  Allergies  Allergen Reactions   Azithromycin     Other reaction(s): GI Upset (intolerance)    No past medical history on  file.  No family history on file.  Social History   Socioeconomic History   Marital status: Unknown    Spouse name: Not on file   Number of children: Not on file   Years of education: Not on file   Highest education level: Not on file  Occupational History   Not on file  Tobacco Use   Smoking status: Former    Types: Cigarettes   Smokeless tobacco: Not on file  Substance and Sexual Activity   Alcohol use: Not on file   Drug use: Not on file   Sexual activity: Not on file  Other Topics Concern   Not on file  Social History Narrative   Not on file   Social Determinants of Health   Financial Resource Strain: Not on file  Food Insecurity: No Food Insecurity (08/15/2022)   [facility] from [facility] prior to [facility]10[facility] prior to [facility]/10/2022[facility]   Hunger Vital Sign    Worried About Running Out of Food in the Last Year: Never true    Ran Out of Food in the Last Year: Never true  Transportation Needs: No Transportation Needs (08/15/2022)   [facility] to [facility]/10/2022[facility] visits prior to 12/10/2022.   PRAPARE - Administrator, Civil Service (Medical): No    Lack of Transportation (Non-Medical): No  Physical Activity: Not on file  Stress: Not on file  Social Connections: Unknown (05/17/2023)   Received from [facility]   Social Network    Social Network: Not on file  Intimate Partner Violence: Unknown (05/17/2023)   Received from [facility]   HITS    Physically Hurt: Not on file    Insult or Talk Down To: Not on file    Threaten Physical Harm: Not on file    Scream or Curse: Not on file    Past Medical History, Surgical history, Social history, and Family history were reviewed and updated as appropriate.   Please see review of systems for further details on the  patient's review from today.   Objective:   Physical Exam:  There were no vitals taken for this visit.  Physical Exam Constitutional:      General: She is not in acute distress. Musculoskeletal:        General: No deformity.  Neurological:     Mental Status: She is alert and oriented to person, place, and time.     Coordination: Coordination normal.  Psychiatric:        Attention and Perception: Attention and perception normal. She does not perceive auditory or visual hallucinations.        Mood and Affect: Affect is not labile, blunt, angry or inappropriate.        Speech: Speech normal.        Behavior: Behavior normal.        Thought Content: Thought content  normal. Thought content is not paranoid or delusional. Thought content does not include homicidal or suicidal ideation. Thought content does not include homicidal or suicidal plan.        Cognition and Memory: Cognition and memory normal.        Judgment: Judgment normal.     Comments: Insight intact     Lab Review:  No results found for: "NA", "K", "CL", "CO2", "GLUCOSE", "BUN", "CREATININE", "CALCIUM", "PROT", "ALBUMIN", "AST", "ALT", "ALKPHOS", "BILITOT", "GFRNONAA", "GFRAA"  No results found for: "WBC", "RBC", "HGB", "HCT", "PLT", "MCV", "MCH", "MCHC", "RDW", "LYMPHSABS", "MONOABS", "EOSABS", "BASOSABS"  No results found for: "POCLITH", "LITHIUM"   No results found for: "PHENYTOIN", "PHENOBARB", "VALPROATE", "CBMZ"   .res Assessment: Plan:    Plan:  PDMP reviewed  Clonazepam 1mg  BID Prozac 40mg  daily Ambien 10mg  at hs  RTC 1 year  Patient advised to contact office with any questions, adverse effects, or acute worsening in signs and symptoms.  Discussed potential benefits, risk, and side effects of benzodiazepines to include potential risk of tolerance and dependence, as well as possible drowsiness.  Advised patient not to drive if experiencing drowsiness and to take lowest possible effective dose to  minimize risk of dependence and tolerance.   There are no diagnoses linked to this encounter.  Please see After Visit Summary for patient specific instructions.  Future Appointments  Date Time Provider Department Center  06/14/2023  3:20 PM Elania Crowl, Thereasa Solo, NP CP-CP None    No orders of the defined types were placed in this encounter.     -------------------------------

## 2023-07-04 ENCOUNTER — Other Ambulatory Visit: Payer: Self-pay | Admitting: Adult Health

## 2023-07-04 DIAGNOSIS — G4700 Insomnia, unspecified: Secondary | ICD-10-CM

## 2023-07-04 MED ORDER — ZOLPIDEM TARTRATE 10 MG PO TABS
10.0000 mg | ORAL_TABLET | Freq: Every evening | ORAL | 2 refills | Status: DC | PRN
Start: 2023-07-04 — End: 2023-09-26

## 2023-07-12 ENCOUNTER — Other Ambulatory Visit: Payer: Self-pay | Admitting: Adult Health

## 2023-07-12 DIAGNOSIS — F411 Generalized anxiety disorder: Secondary | ICD-10-CM

## 2023-07-12 MED ORDER — CLONAZEPAM 1 MG PO TABS
1.0000 mg | ORAL_TABLET | Freq: Three times a day (TID) | ORAL | 2 refills | Status: DC | PRN
Start: 2023-07-12 — End: 2023-08-08

## 2023-08-08 ENCOUNTER — Other Ambulatory Visit: Payer: Self-pay | Admitting: Adult Health

## 2023-08-08 DIAGNOSIS — F411 Generalized anxiety disorder: Secondary | ICD-10-CM

## 2023-08-08 MED ORDER — CLONAZEPAM 1 MG PO TABS: 1.0000 mg | ORAL_TABLET | Freq: Two times a day (BID) | ORAL | 2 refills | Status: DC | PRN

## 2023-09-18 IMAGING — CR DG KNEE COMPLETE 4+V*R*
4 series · 4 of 4 positions shown · non-contrast
Comparison: None Available.

CLINICAL DATA: Fall.

EXAM:
RIGHT KNEE - COMPLETE 4+ VIEW

[t knee ap right]
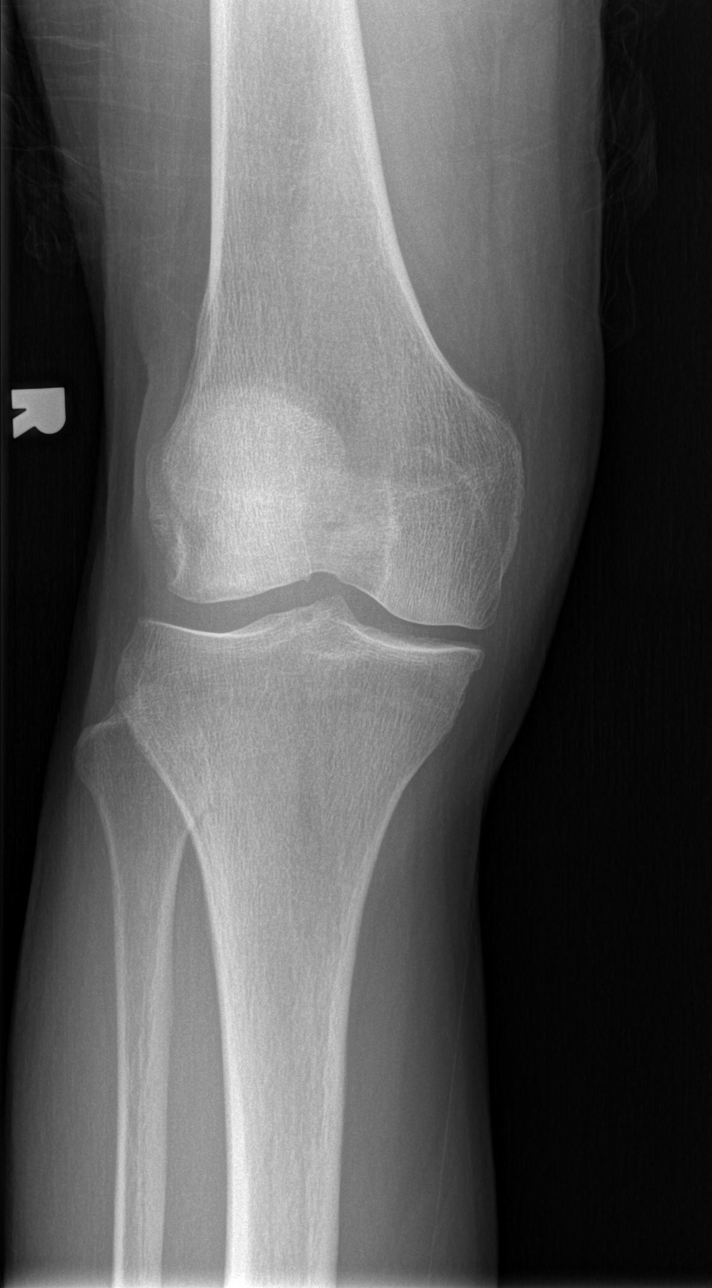

[t knee oblique right (1 of 2)]
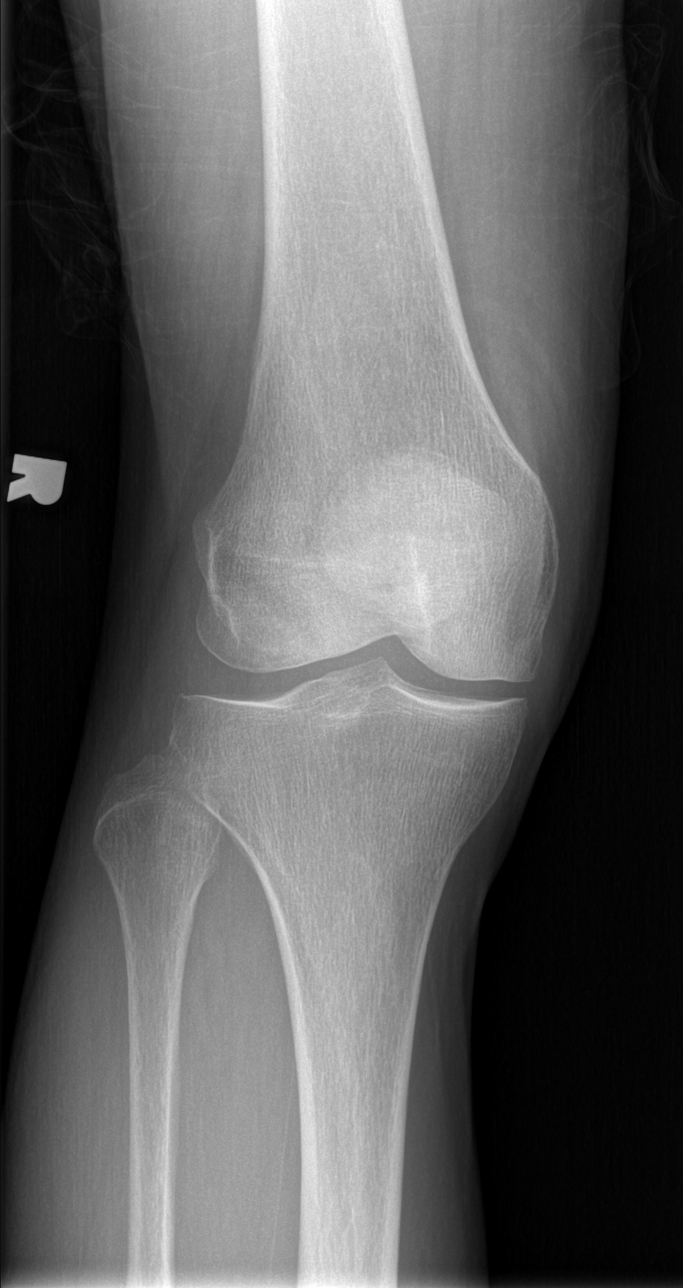

[t knee oblique right (2 of 2)]
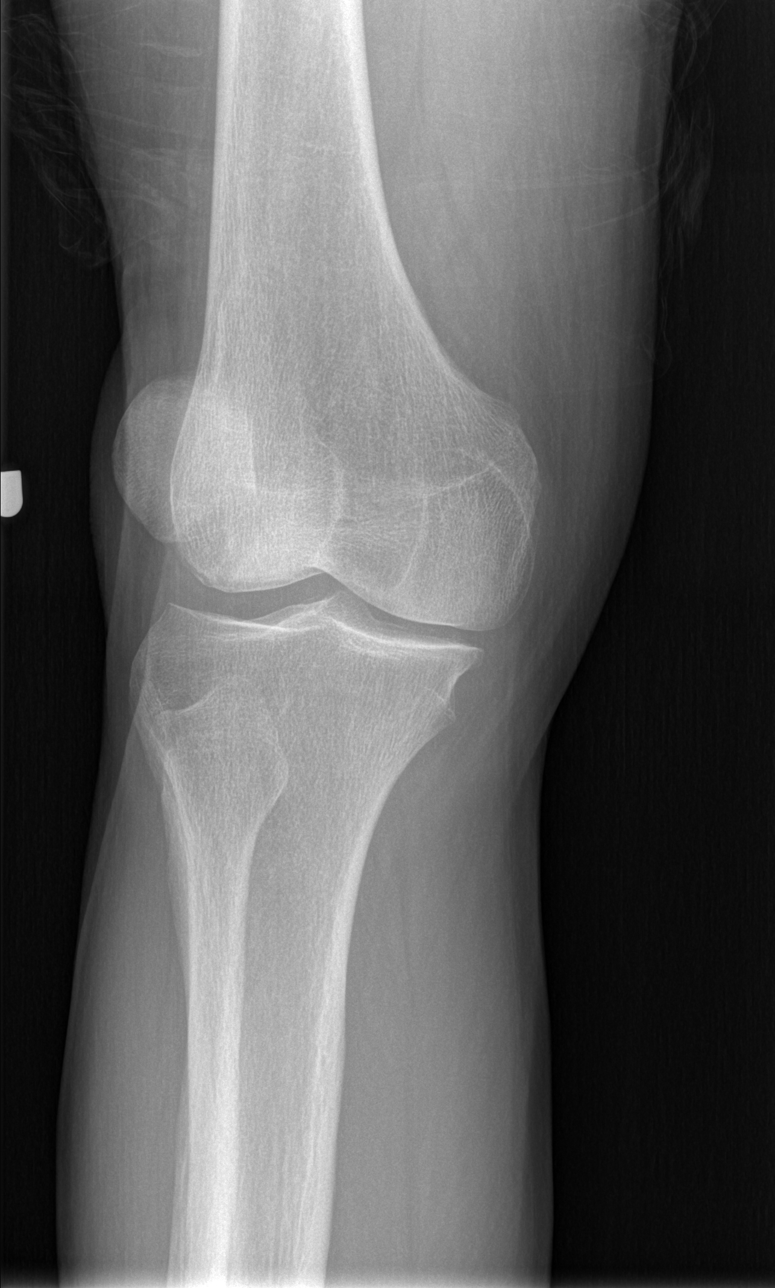

[t knee lat right]
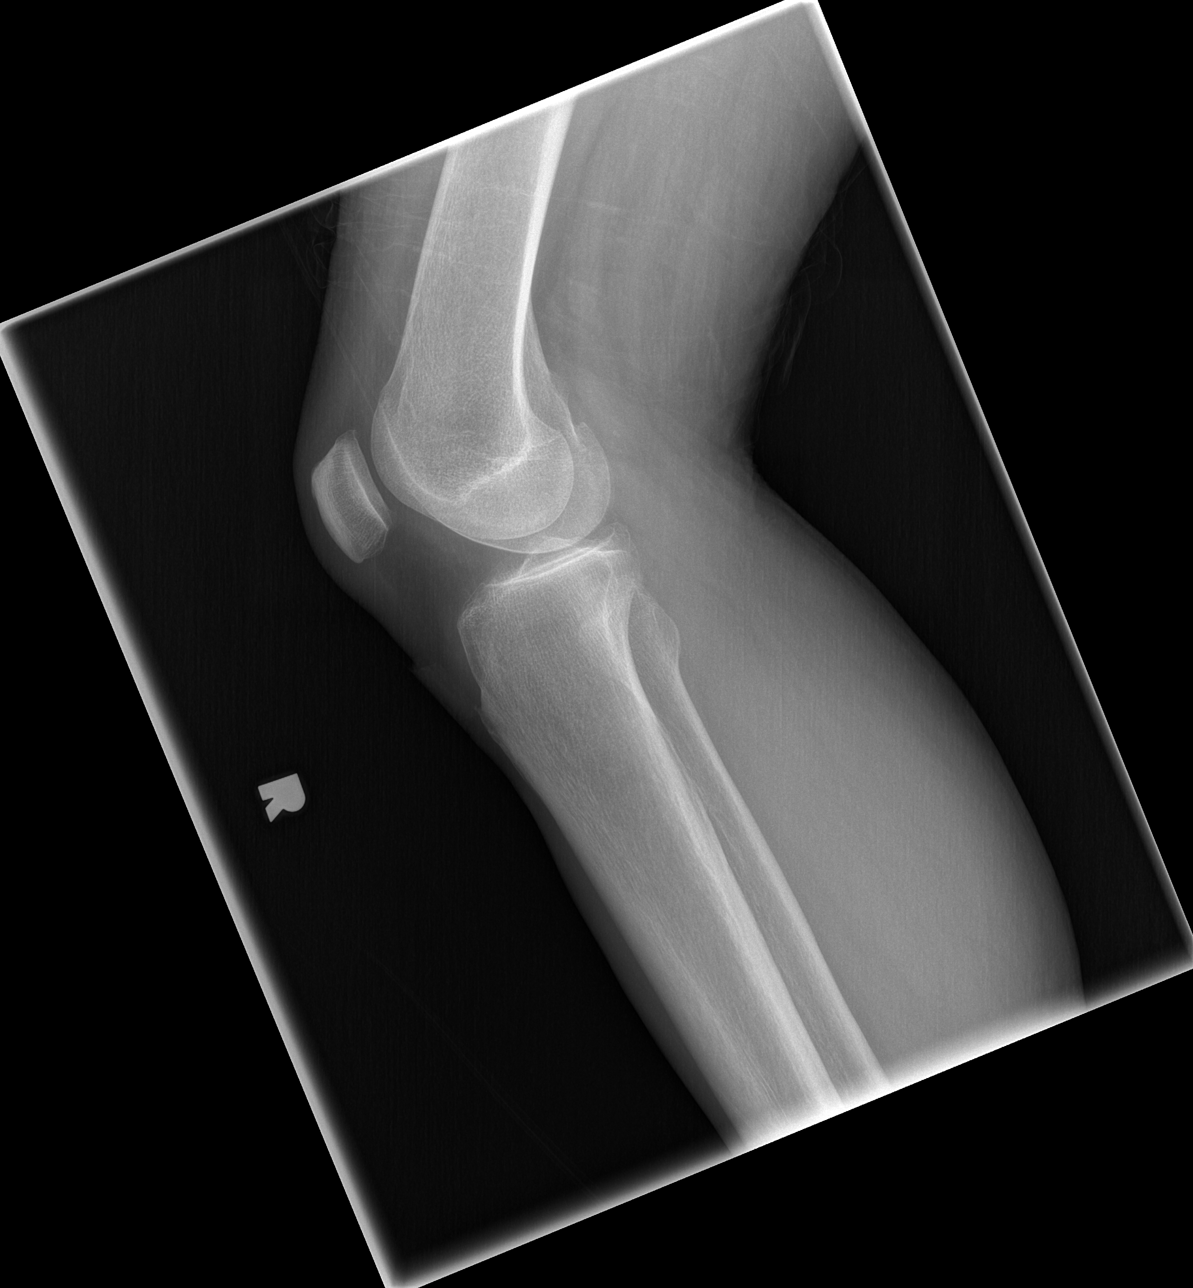

[4 of 4 positions shown; findings below may reference images not displayed]

FINDINGS: No fracture, dislocation, or knee joint effusion is identified.
There is mild medial compartment joint space narrowing. The soft
tissues are unremarkable.
IMPRESSION: No acute osseous abnormality identified.

## 2023-09-18 IMAGING — CT CT HIP*R* W/O CM
2 of 3 series · 17 of 46 positions shown, 19 images · non-contrast
Comparison: Right hip x-rays from same day.

CLINICAL DATA: Right hip pain after fall.

EXAM:
CT OF THE RIGHT HIP WITHOUT CONTRAST
TECHNIQUE: Multidetector CT imaging of the right hip was performed according to
the standard protocol. Multiplanar CT image reconstructions were
also generated.
RADIATION DOSE REDUCTION: This exam was performed according to the
departmental dose-optimization program which includes automated
exposure control, adjustment of the mA and/or kV according to
patient size and/or use of iterative reconstruction technique.

[Series 5: axial soft tissue · axial · 0.56mm/px · z∈[+522,+760]mm · 14 of 137 slices shown, 16 images]
[im 9/137  soft-tissue]
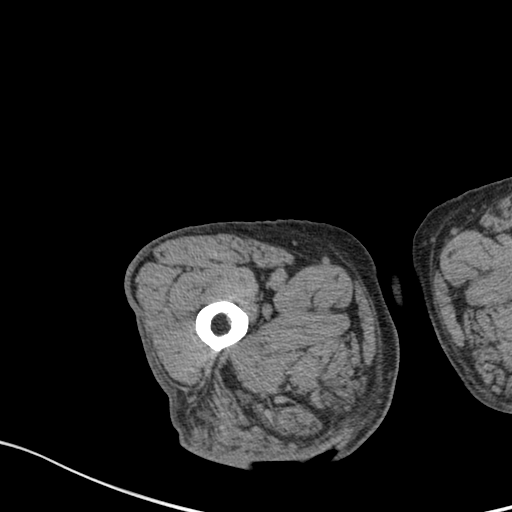
[im 9/137  bone]
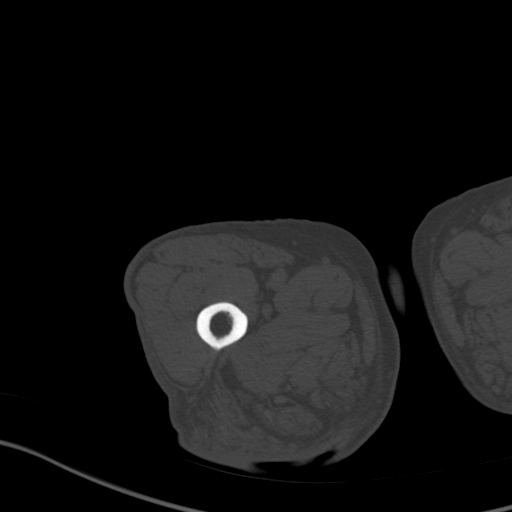
[im 18/137  soft-tissue]
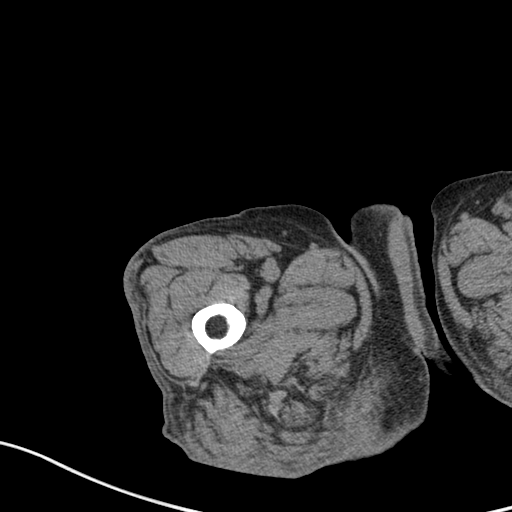
[im 27/137  soft-tissue]
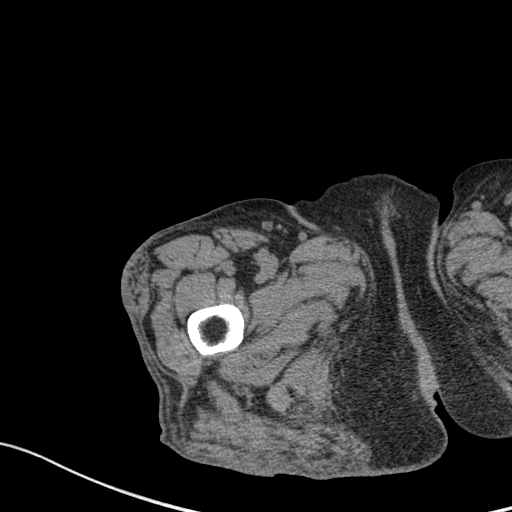
[im 36/137  soft-tissue]
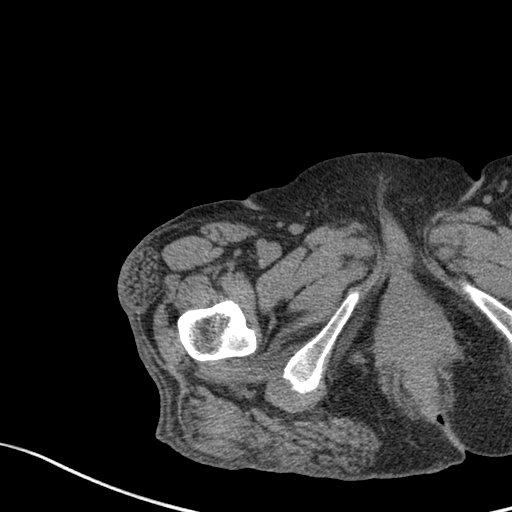
[im 44/137  soft-tissue]
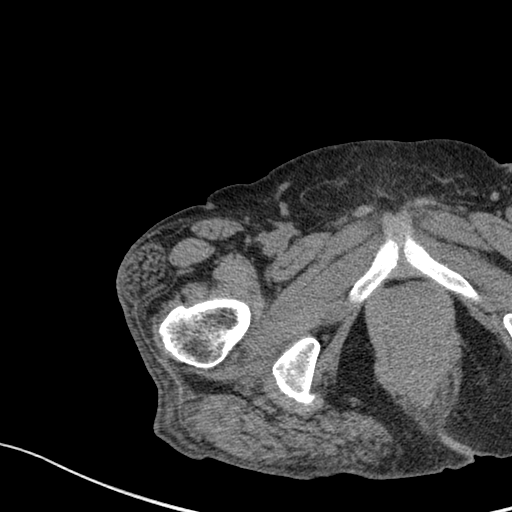
[im 53/137  soft-tissue]
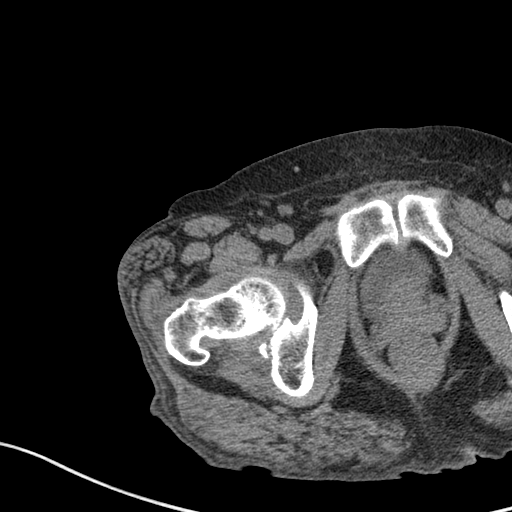
[im 62/137  soft-tissue]
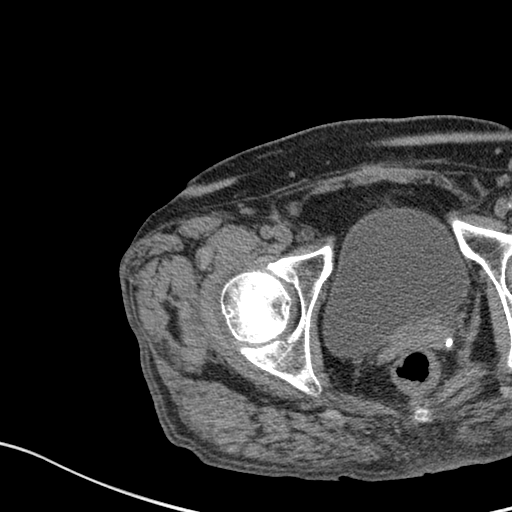
[im 75/137  soft-tissue]
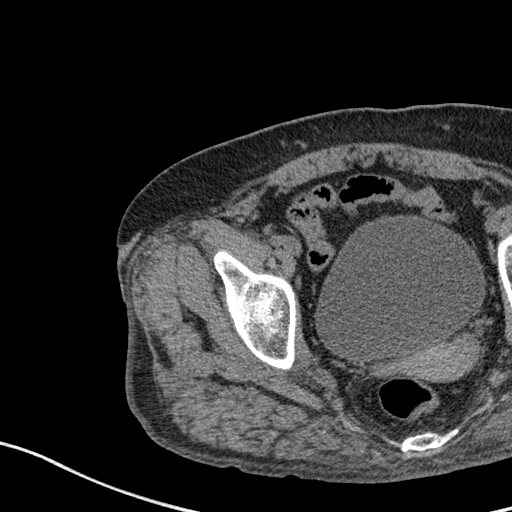
[im 84/137  soft-tissue]
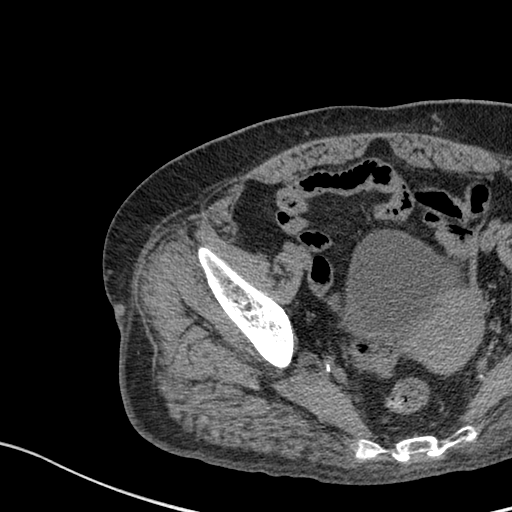
[im 84/137  bone]
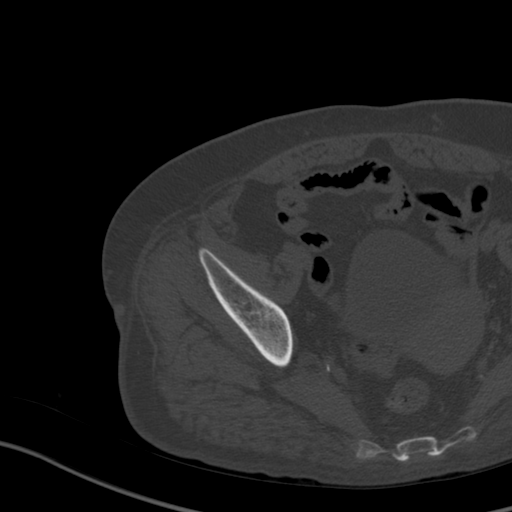
[im 93/137  soft-tissue]
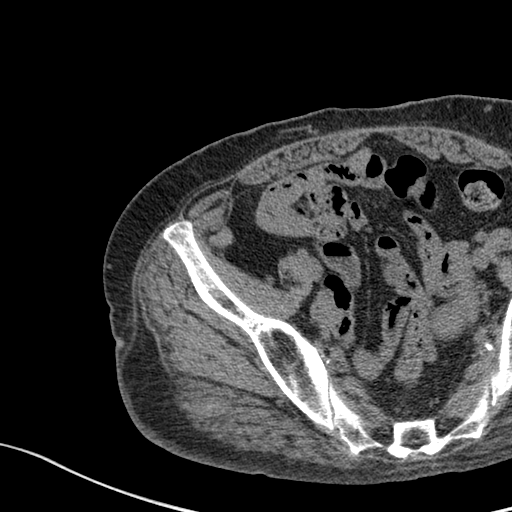
[im 101/137  soft-tissue]
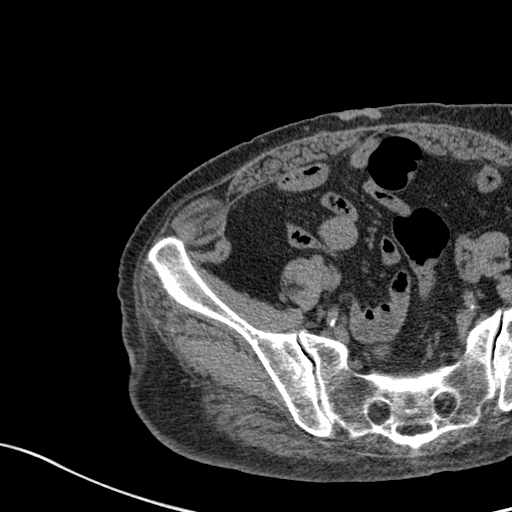
[im 110/137  soft-tissue]
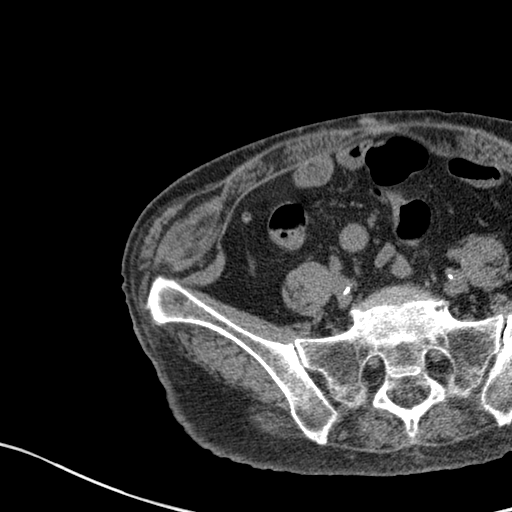
[im 119/137  soft-tissue]
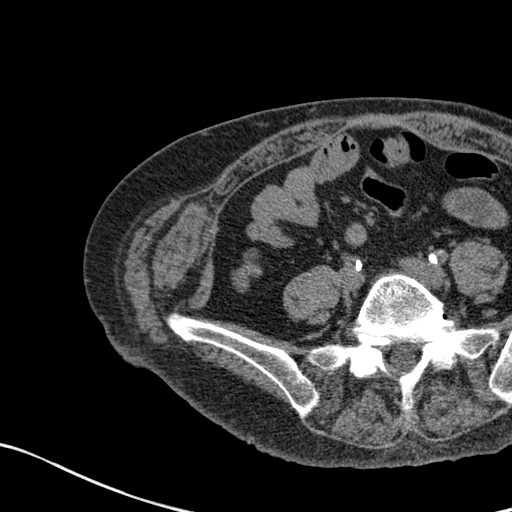
[im 128/137  soft-tissue]
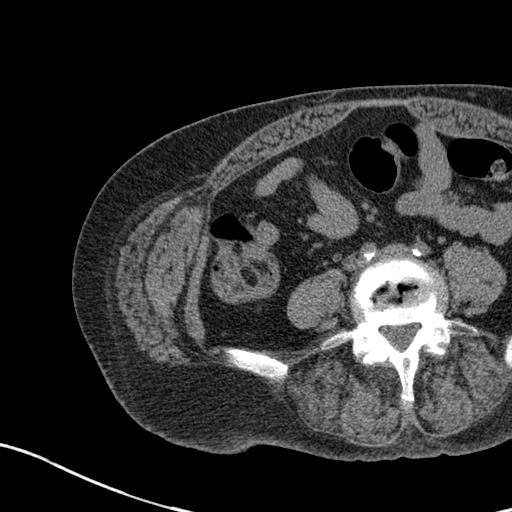

[Series 8: coronal st · coronal · 0.49mm/px · 3 of 137 slices shown]
[im 46/137  soft-tissue]
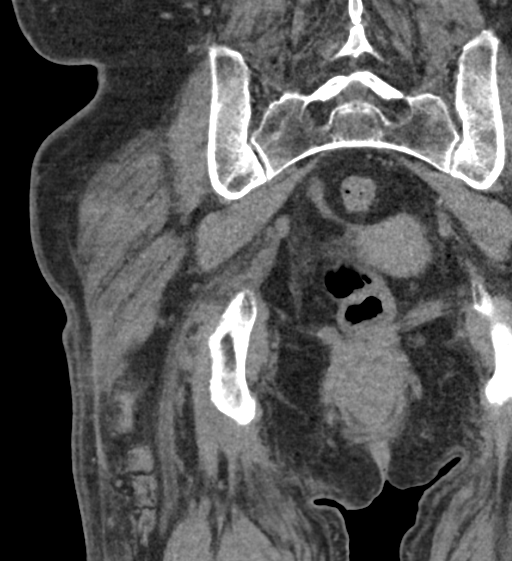
[im 61/137  soft-tissue]
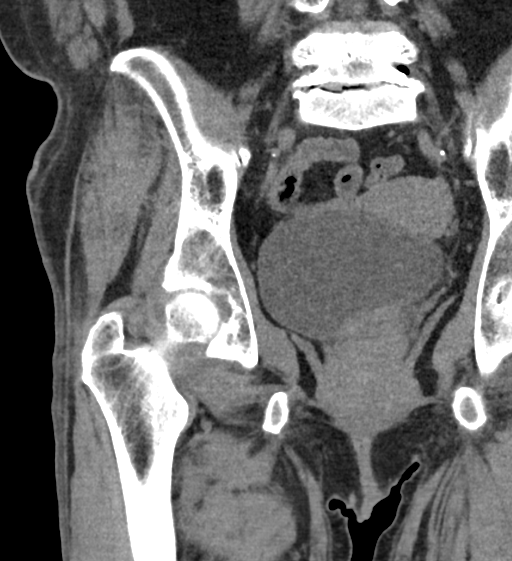
[im 76/137  soft-tissue]
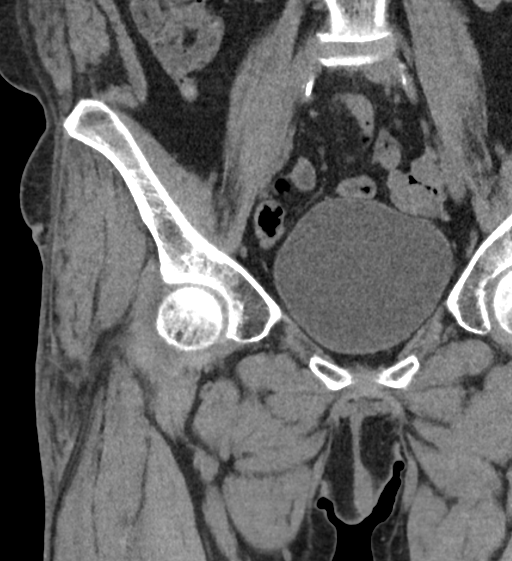

[17 of 46 positions shown; findings below may reference images not displayed]

FINDINGS: Bones/Joint/Cartilage

Acute nondisplaced fracture of the posterior acetabulum. No
additional fracture. No dislocation. Joint spaces are preserved. No
significant joint effusion.

Ligaments

Ligaments are suboptimally evaluated by CT.

Muscles and Tendons
Grossly intact.

Soft tissue
No fluid collection or hematoma.  No soft tissue mass.
IMPRESSION: 1. Acute nondisplaced isolated posterior wall fracture of the right
acetabulum.

## 2023-09-25 ENCOUNTER — Other Ambulatory Visit: Payer: Self-pay | Admitting: Adult Health

## 2023-09-25 DIAGNOSIS — G4700 Insomnia, unspecified: Secondary | ICD-10-CM

## 2023-09-26 ENCOUNTER — Other Ambulatory Visit: Payer: Self-pay | Admitting: Adult Health

## 2023-09-26 DIAGNOSIS — G4700 Insomnia, unspecified: Secondary | ICD-10-CM

## 2023-09-26 MED ORDER — ZOLPIDEM TARTRATE 10 MG PO TABS: 10.0000 mg | ORAL_TABLET | Freq: Every evening | ORAL | 2 refills | Status: DC | PRN

## 2023-10-02 MED ORDER — ZOLPIDEM TARTRATE 10 MG PO TABS: 10.0000 mg | ORAL_TABLET | Freq: Every evening | ORAL | 2 refills | Status: DC | PRN

## 2023-11-06 ENCOUNTER — Other Ambulatory Visit: Payer: Self-pay | Admitting: Adult Health

## 2023-11-06 DIAGNOSIS — F411 Generalized anxiety disorder: Secondary | ICD-10-CM

## 2023-11-06 NOTE — Telephone Encounter (Signed)
Lf 12/27 LV 9/4 DUE BACK IN 1 YR

## 2024-01-23 ENCOUNTER — Other Ambulatory Visit: Payer: Self-pay | Admitting: Adult Health

## 2024-01-23 DIAGNOSIS — G4700 Insomnia, unspecified: Secondary | ICD-10-CM

## 2024-01-23 MED ORDER — ZOLPIDEM TARTRATE 10 MG PO TABS: 10.0000 mg | ORAL_TABLET | Freq: Every evening | ORAL | 2 refills | Status: DC | PRN

## 2024-01-30 ENCOUNTER — Other Ambulatory Visit: Payer: Self-pay | Admitting: Adult Health

## 2024-01-30 DIAGNOSIS — F411 Generalized anxiety disorder: Secondary | ICD-10-CM

## 2024-01-30 MED ORDER — CLONAZEPAM 1 MG PO TABS: 1.0000 mg | ORAL_TABLET | Freq: Two times a day (BID) | ORAL | 2 refills | Status: DC | PRN

## 2024-03-06 ENCOUNTER — Other Ambulatory Visit: Payer: Self-pay | Admitting: Adult Health

## 2024-03-06 DIAGNOSIS — G4700 Insomnia, unspecified: Secondary | ICD-10-CM

## 2024-04-09 ENCOUNTER — Other Ambulatory Visit: Payer: Self-pay | Admitting: Adult Health

## 2024-04-09 DIAGNOSIS — G4700 Insomnia, unspecified: Secondary | ICD-10-CM

## 2024-04-09 MED ORDER — FLUOXETINE HCL 20 MG PO CAPS: 20.0000 mg | ORAL_CAPSULE | Freq: Every day | ORAL | 0 refills | Status: DC

## 2024-04-24 ENCOUNTER — Other Ambulatory Visit: Payer: Self-pay | Admitting: Adult Health

## 2024-04-24 DIAGNOSIS — G4700 Insomnia, unspecified: Secondary | ICD-10-CM

## 2024-04-24 DIAGNOSIS — F411 Generalized anxiety disorder: Secondary | ICD-10-CM

## 2024-04-24 MED ORDER — CLONAZEPAM 1 MG PO TABS: 1.0000 mg | ORAL_TABLET | Freq: Two times a day (BID) | ORAL | 2 refills | Status: DC | PRN

## 2024-04-24 MED ORDER — ZOLPIDEM TARTRATE 10 MG PO TABS: 10.0000 mg | ORAL_TABLET | Freq: Every evening | ORAL | 2 refills | Status: DC | PRN

## 2024-05-21 ENCOUNTER — Other Ambulatory Visit: Payer: Self-pay | Admitting: Adult Health

## 2024-05-21 DIAGNOSIS — F411 Generalized anxiety disorder: Secondary | ICD-10-CM

## 2024-05-21 DIAGNOSIS — G4700 Insomnia, unspecified: Secondary | ICD-10-CM

## 2024-05-21 MED ORDER — ZOLPIDEM TARTRATE 10 MG PO TABS
10.0000 mg | ORAL_TABLET | Freq: Every evening | ORAL | 2 refills | Status: AC | PRN
Start: 2024-05-21 — End: 2024-08-19

## 2024-05-21 MED ORDER — FLUOXETINE HCL 20 MG PO CAPS
20.0000 mg | ORAL_CAPSULE | Freq: Every day | ORAL | 0 refills | Status: DC
Start: 2024-05-21 — End: 2024-07-04

## 2024-05-21 MED ORDER — CLONAZEPAM 1 MG PO TABS: 1.0000 mg | ORAL_TABLET | Freq: Two times a day (BID) | ORAL | 2 refills | Status: DC | PRN

## 2024-06-25 ENCOUNTER — Telehealth: Payer: Self-pay | Admitting: Adult Health

## 2024-07-04 ENCOUNTER — Telehealth: Payer: Self-pay | Admitting: Adult Health

## 2024-07-04 ENCOUNTER — Encounter: Payer: Self-pay | Admitting: Adult Health

## 2024-07-04 DIAGNOSIS — G4700 Insomnia, unspecified: Secondary | ICD-10-CM | POA: Diagnosis not present

## 2024-07-04 DIAGNOSIS — F411 Generalized anxiety disorder: Secondary | ICD-10-CM

## 2024-07-04 MED ORDER — FLUOXETINE HCL 20 MG PO CAPS: 20.0000 mg | ORAL_CAPSULE | Freq: Every day | ORAL | 3 refills | Status: DC

## 2024-07-04 MED ORDER — ZOLPIDEM TARTRATE 10 MG PO TABS: 10.0000 mg | ORAL_TABLET | Freq: Every evening | ORAL | 2 refills | Status: DC | PRN

## 2024-07-04 MED ORDER — CLONAZEPAM 1 MG PO TABS: 1.0000 mg | ORAL_TABLET | Freq: Two times a day (BID) | ORAL | 2 refills | Status: DC | PRN

## 2024-07-04 NOTE — Progress Notes (Signed)
 Amber Joyce [id number] [birth year] 58 y.o.  Virtual Visit via Video Note  I connected with pt @ on 07/04/24 at 10:00 AM EDT by a video enabled telemedicine application and verified that I am speaking with the correct person using two identifiers.   I discussed the limitations of evaluation and management by telemedicine and the availability of in person appointments. The patient expressed understanding and agreed to proceed.  I discussed the assessment and treatment plan with the patient. The patient was provided an opportunity to ask questions and all were answered. The patient agreed with the plan and demonstrated an understanding of the instructions.   The patient was advised to call back or seek an in-person evaluation if the symptoms worsen or if the condition fails to improve as anticipated.  I provided 10 minutes of non-face-to-face time during this encounter.  The patient was located at home.  The provider was located at [facility].   Angeline LOISE Sayers, NP   Subjective:   Patient ID:  Amber Joyce is a 58 y.o. (DOB 01-03-1966) female.  Chief Complaint: No chief complaint on file.   HPI Lillia Lengel presents for follow-up of GAD and insomnia.  Describes mood today as ok. Pleasant. Denies tearfulness. Mood symptoms - denies depression, anxiety and irritability. Reports stable interest and motivation. Denies panic attacks. Denies worry, rumination and over thinking. Reports mood is stable. Stating I fell like I'm doing alright. Feels like medications work well for her. Taking medications as prescribed.  Energy levels stable. Active, does not have a regular exercise routine. Enjoys some usual interests and activities. Married. Lives with husband. Daughter and grandson local. Spending time with family. Appetite adequate. Weight loss. Sleeps well most nights. Averages 7 to 8 hours. Focus and concentration stable. Completing tasks. Managing aspects of household. Working  full time. Denies SI or HI.  Denies AH or VH. Denies self harm. Denies substance use.  Previous medication trials:  Denies   Review of Systems:  Review of Systems  Musculoskeletal:  Negative for gait problem.  Neurological:  Negative for tremors.  Psychiatric/Behavioral:         Please refer to HPI    Medications: I have reviewed the patient's current medications.  Current Outpatient Medications  Medication Sig Dispense Refill   clonazePAM  (KLONOPIN ) 1 MG tablet Take 1 tablet (1 mg total) by mouth 2 (two) times daily as needed. 60 tablet 2   fenofibrate micronized (LOFIBRA) 200 MG capsule Take 200 mg by mouth daily.     FLUoxetine  (PROZAC ) 20 MG capsule Take 1 capsule (20 mg total) by mouth daily. 90 capsule 0   fluticasone (FLONASE) 50 MCG/ACT nasal spray Place into both nostrils.     metFORMIN (GLUCOPHAGE) 1000 MG tablet Take 1,000 mg by mouth daily.     omeprazole (PRILOSEC) 40 MG capsule Take 40 mg by mouth 2 (two) times daily.     oxyCODONE -acetaminophen  (PERCOCET/ROXICET) 5-325 MG tablet Take 1 tablet by mouth every 8 (eight) hours as needed for severe pain. 10 tablet 0   OZEMPIC, 1 MG/DOSE, 4 MG/3ML SOPN      predniSONE (DELTASONE) 1 MG tablet Take 4 mg by mouth daily.     promethazine (PHENERGAN) 25 MG tablet Take by mouth.     rosuvastatin (CRESTOR) 20 MG tablet Take 20 mg by mouth at bedtime.     telmisartan-hydrochlorothiazide (MICARDIS HCT) 40-12.5 MG tablet Take 1 tablet by mouth daily.     zolpidem  (AMBIEN ) 10 MG tablet Take 1 tablet (  10 mg total) by mouth at bedtime as needed for sleep. 30 tablet 2   zolpidem  (AMBIEN ) 10 MG tablet Take 1 tablet (10 mg total) by mouth at bedtime as needed for sleep. 30 tablet 2   No current facility-administered medications for this visit.    Medication Side Effects: None  Allergies:  Allergies  Allergen Reactions   Azithromycin     Other reaction(s): GI Upset (intolerance)    No past medical history on file.  No family  history on file.  Social History   Socioeconomic History   Marital status: Unknown    Spouse name: Not on file   Number of children: Not on file   Years of education: Not on file   Highest education level: Not on file  Occupational History   Not on file  Tobacco Use   Smoking status: Former    Types: Cigarettes   Smokeless tobacco: Not on file  Substance and Sexual Activity   Alcohol use: Not on file   Drug use: Not on file   Sexual activity: Not on file  Other Topics Concern   Not on file  Social History Narrative   Not on file   Social Drivers of Health   Financial Resource Strain: Not on file  Food Insecurity: Low Risk  (03/12/2024)   Received from [facility]   Hunger Vital Sign    Within the past 12 months, you worried that your food would run out before you got money to buy more: Never true    Within the past 12 months, the food you bought just didn't last and you didn't have money to get more. : Never true  Transportation Needs: No Transportation Needs (03/12/2024)   Received from [facility]    In the past 12 months, has lack of reliable transportation kept you from medical appointments, meetings, work or from getting things needed for daily living? : No  Physical Activity: Not on file  Stress: Not on file  Social Connections: Unknown (05/17/2023)   Received from [facility]   Social Network    Social Network: Not on file  Intimate Partner Violence: Unknown (05/17/2023)   Received from [facility]   HITS    Physically Hurt: Not on file    Insult or Talk Down To: Not on file    Threaten Physical Harm: Not on file    Scream or Curse: Not on file    Past Medical History, Surgical history, Social history, and Family history were reviewed and updated as appropriate.   Please see review of systems for further details on the patient's review from today.   Objective:   Physical Exam:  There were no vitals taken for this visit.  Physical  Exam Constitutional:      General: She is not in acute distress. Musculoskeletal:        General: No deformity.  Neurological:     Mental Status: She is alert and oriented to person, place, and time.     Coordination: Coordination normal.  Psychiatric:        Attention and Perception: Attention and perception normal. She does not perceive auditory or visual hallucinations.        Mood and Affect: Mood normal. Mood is not anxious or depressed. Affect is not labile, blunt, angry or inappropriate.        Speech: Speech normal.        Behavior: Behavior normal.  Thought Content: Thought content normal. Thought content is not paranoid or delusional. Thought content does not include homicidal or suicidal ideation. Thought content does not include homicidal or suicidal plan.        Cognition and Memory: Cognition and memory normal.        Judgment: Judgment normal.     Comments: Insight intact     Lab Review:  No results found for: NA, K, CL, CO2, GLUCOSE, BUN, CREATININE, CALCIUM, PROT, ALBUMIN, AST, ALT, ALKPHOS, BILITOT, GFRNONAA, GFRAA  No results found for: WBC, RBC, HGB, HCT, PLT, MCV, MCH, MCHC, RDW, LYMPHSABS, MONOABS, EOSABS, BASOSABS  No results found for: POCLITH, LITHIUM   No results found for: PHENYTOIN, PHENOBARB, VALPROATE, CBMZ   .res Assessment: Plan:    Plan:  PDMP reviewed  Clonazepam  1mg  BID Prozac  40mg  daily Ambien  10mg  at hs  RTC 1 year  Patient advised to contact office with any questions, adverse effects, or acute worsening in signs and symptoms.  Discussed potential benefits, risk, and side effects of benzodiazepines to include potential risk of tolerance and dependence, as well as possible drowsiness.  Advised patient not to drive if experiencing drowsiness and to take lowest possible effective dose to minimize risk of dependence and tolerance.   There are no diagnoses linked  to this encounter.   Please see After Visit Summary for patient specific instructions.  No future appointments.  No orders of the defined types were placed in this encounter.     -------------------------------

## 2024-08-19 ENCOUNTER — Other Ambulatory Visit: Payer: Self-pay | Admitting: Adult Health

## 2024-08-19 DIAGNOSIS — F411 Generalized anxiety disorder: Secondary | ICD-10-CM

## 2024-08-19 MED ORDER — CLONAZEPAM 1 MG PO TABS: 1.0000 mg | ORAL_TABLET | Freq: Two times a day (BID) | ORAL | 2 refills | Status: AC | PRN

## 2024-09-13 ENCOUNTER — Other Ambulatory Visit: Payer: Self-pay | Admitting: Adult Health

## 2024-09-13 DIAGNOSIS — G4700 Insomnia, unspecified: Secondary | ICD-10-CM

## 2024-09-13 DIAGNOSIS — F411 Generalized anxiety disorder: Secondary | ICD-10-CM

## 2024-09-13 MED ORDER — CLONAZEPAM 1 MG PO TABS: 1.0000 mg | ORAL_TABLET | Freq: Two times a day (BID) | ORAL | 2 refills | Status: AC | PRN

## 2024-09-13 MED ORDER — ZOLPIDEM TARTRATE 10 MG PO TABS: 10.0000 mg | ORAL_TABLET | Freq: Every evening | ORAL | 2 refills | Status: AC | PRN

## 2024-09-13 NOTE — Telephone Encounter (Signed)
 Pt rtc stated she does still take Zolpidem . Requested RF.

## 2024-09-13 NOTE — Telephone Encounter (Signed)
 LVM to [facility] - is she still taking zolpidem 

## 2024-10-16 ENCOUNTER — Other Ambulatory Visit: Payer: Self-pay | Admitting: Adult Health

## 2024-10-16 DIAGNOSIS — G4700 Insomnia, unspecified: Secondary | ICD-10-CM

## 2024-10-16 MED ORDER — FLUOXETINE HCL 20 MG PO CAPS: ORAL_CAPSULE | ORAL | 3 refills | Status: AC
